# Patient Record
Sex: Female | Born: 2000 | Race: White | Hispanic: No | Marital: Single | Smoking: Never smoker
Health system: Southern US, Community
[De-identification: ages and names within clinical notes are randomized; demographics above are authoritative.]

## PROBLEM LIST (undated history)

## (undated) DIAGNOSIS — T7840XA Allergy, unspecified, initial encounter: Secondary | ICD-10-CM

## (undated) HISTORY — DX: Allergy, unspecified, initial encounter: T78.40XA

---
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
## 2022-05-22 ENCOUNTER — Other Ambulatory Visit: Payer: Self-pay | Admitting: Nurse Practitioner

## 2022-05-22 NOTE — Telephone Encounter (Signed)
Requested Prescriptions  Pending Prescriptions Disp Refills   fluticasone (FLONASE) 50 MCG/ACT nasal spray [Pharmacy Med Name: FLUTICASONE PROP 50 MCG SPRAY] 48 mL 1    Sig: USE 1-2 SPRAYS IN EACH NOSTRIL ONCE A DAY     Ear, Nose, and Throat: Nasal Preparations - Corticosteroids Passed - 05/22/2022 11:05 AM      Passed - Valid encounter within last 12 months    Recent Outpatient Visits           9 months ago Annual physical exam   [place] [facility] Larae Grooms, NP   1 year ago Seasonal allergies   [place] [facility] Larae Grooms, NP       Future Appointments             In 2 months Larae Grooms, NP [place] [facility]

## 2022-06-24 DIAGNOSIS — M25511 Pain in right shoulder: Secondary | ICD-10-CM | POA: Diagnosis not present

## 2022-06-24 DIAGNOSIS — M7918 Myalgia, other site: Secondary | ICD-10-CM | POA: Diagnosis not present

## 2022-06-24 DIAGNOSIS — M7501 Adhesive capsulitis of right shoulder: Secondary | ICD-10-CM | POA: Diagnosis not present

## 2022-06-25 DIAGNOSIS — M9901 Segmental and somatic dysfunction of cervical region: Secondary | ICD-10-CM | POA: Diagnosis not present

## 2022-06-25 DIAGNOSIS — M25511 Pain in right shoulder: Secondary | ICD-10-CM | POA: Diagnosis not present

## 2022-06-25 DIAGNOSIS — M7501 Adhesive capsulitis of right shoulder: Secondary | ICD-10-CM | POA: Diagnosis not present

## 2022-06-25 DIAGNOSIS — M7918 Myalgia, other site: Secondary | ICD-10-CM | POA: Diagnosis not present

## 2022-06-25 DIAGNOSIS — M5413 Radiculopathy, cervicothoracic region: Secondary | ICD-10-CM | POA: Diagnosis not present

## 2022-07-01 DIAGNOSIS — M7501 Adhesive capsulitis of right shoulder: Secondary | ICD-10-CM | POA: Diagnosis not present

## 2022-07-01 DIAGNOSIS — M7918 Myalgia, other site: Secondary | ICD-10-CM | POA: Diagnosis not present

## 2022-07-01 DIAGNOSIS — M9901 Segmental and somatic dysfunction of cervical region: Secondary | ICD-10-CM | POA: Diagnosis not present

## 2022-07-01 DIAGNOSIS — M5413 Radiculopathy, cervicothoracic region: Secondary | ICD-10-CM | POA: Diagnosis not present

## 2022-07-01 DIAGNOSIS — M25511 Pain in right shoulder: Secondary | ICD-10-CM | POA: Diagnosis not present

## 2022-07-03 DIAGNOSIS — M7501 Adhesive capsulitis of right shoulder: Secondary | ICD-10-CM | POA: Diagnosis not present

## 2022-07-03 DIAGNOSIS — M25511 Pain in right shoulder: Secondary | ICD-10-CM | POA: Diagnosis not present

## 2022-07-03 DIAGNOSIS — M7918 Myalgia, other site: Secondary | ICD-10-CM | POA: Diagnosis not present

## 2022-07-03 DIAGNOSIS — M5413 Radiculopathy, cervicothoracic region: Secondary | ICD-10-CM | POA: Diagnosis not present

## 2022-07-03 DIAGNOSIS — M9901 Segmental and somatic dysfunction of cervical region: Secondary | ICD-10-CM | POA: Diagnosis not present

## 2022-07-09 DIAGNOSIS — M5413 Radiculopathy, cervicothoracic region: Secondary | ICD-10-CM | POA: Diagnosis not present

## 2022-07-09 DIAGNOSIS — M9901 Segmental and somatic dysfunction of cervical region: Secondary | ICD-10-CM | POA: Diagnosis not present

## 2022-07-09 DIAGNOSIS — M25511 Pain in right shoulder: Secondary | ICD-10-CM | POA: Diagnosis not present

## 2022-07-09 DIAGNOSIS — M7501 Adhesive capsulitis of right shoulder: Secondary | ICD-10-CM | POA: Diagnosis not present

## 2022-07-09 DIAGNOSIS — M7918 Myalgia, other site: Secondary | ICD-10-CM | POA: Diagnosis not present

## 2022-07-10 DIAGNOSIS — M9901 Segmental and somatic dysfunction of cervical region: Secondary | ICD-10-CM | POA: Diagnosis not present

## 2022-07-10 DIAGNOSIS — M25511 Pain in right shoulder: Secondary | ICD-10-CM | POA: Diagnosis not present

## 2022-07-10 DIAGNOSIS — M7501 Adhesive capsulitis of right shoulder: Secondary | ICD-10-CM | POA: Diagnosis not present

## 2022-07-10 DIAGNOSIS — M7918 Myalgia, other site: Secondary | ICD-10-CM | POA: Diagnosis not present

## 2022-07-10 DIAGNOSIS — M5413 Radiculopathy, cervicothoracic region: Secondary | ICD-10-CM | POA: Diagnosis not present

## 2022-07-14 ENCOUNTER — Encounter: Payer: Self-pay | Admitting: Nurse Practitioner

## 2022-07-15 ENCOUNTER — Other Ambulatory Visit: Payer: Self-pay | Admitting: Nurse Practitioner

## 2022-07-15 DIAGNOSIS — M7918 Myalgia, other site: Secondary | ICD-10-CM | POA: Diagnosis not present

## 2022-07-15 DIAGNOSIS — M25511 Pain in right shoulder: Secondary | ICD-10-CM | POA: Diagnosis not present

## 2022-07-15 DIAGNOSIS — M9901 Segmental and somatic dysfunction of cervical region: Secondary | ICD-10-CM | POA: Diagnosis not present

## 2022-07-15 DIAGNOSIS — M7501 Adhesive capsulitis of right shoulder: Secondary | ICD-10-CM | POA: Diagnosis not present

## 2022-07-15 DIAGNOSIS — M5413 Radiculopathy, cervicothoracic region: Secondary | ICD-10-CM | POA: Diagnosis not present

## 2022-07-15 MED ORDER — NORTREL 7/7/7 0.5/0.75/1-35 MG-MCG PO TABS: 1.0000 | ORAL_TABLET | Freq: Every day | ORAL | 0 refills | Status: DC

## 2022-07-15 NOTE — Telephone Encounter (Signed)
Requested Prescriptions  Refused Prescriptions Disp Refills   NORTREL 7/7/7 0.5/0.75/1-35 MG-MCG tablet [Pharmacy Med Name: NORTREL 7-08-17-26 TABLET] 84 tablet 3    Sig: TAKE 1 TABLET BY MOUTH EVERY DAY     OB/GYN:  Contraceptives Passed - 07/15/2022  6:52 AM      Passed - Last BP in normal range    BP Readings from Last 1 Encounters:  08/08/21 109/73         Passed - Valid encounter within last 12 months    Recent Outpatient Visits           11 months ago Annual physical exam   [facility] [facility] Larae Grooms, NP   1 year ago Seasonal allergies   [place] [facility] Larae Grooms, NP       Future Appointments             In 3 weeks Larae Grooms, NP [facility]            Passed - Patient is not a smoker

## 2022-07-17 DIAGNOSIS — M5413 Radiculopathy, cervicothoracic region: Secondary | ICD-10-CM | POA: Diagnosis not present

## 2022-07-17 DIAGNOSIS — M7501 Adhesive capsulitis of right shoulder: Secondary | ICD-10-CM | POA: Diagnosis not present

## 2022-07-17 DIAGNOSIS — M7918 Myalgia, other site: Secondary | ICD-10-CM | POA: Diagnosis not present

## 2022-07-17 DIAGNOSIS — M25511 Pain in right shoulder: Secondary | ICD-10-CM | POA: Diagnosis not present

## 2022-07-17 DIAGNOSIS — M9901 Segmental and somatic dysfunction of cervical region: Secondary | ICD-10-CM | POA: Diagnosis not present

## 2022-07-22 DIAGNOSIS — M7501 Adhesive capsulitis of right shoulder: Secondary | ICD-10-CM | POA: Diagnosis not present

## 2022-07-22 DIAGNOSIS — M5413 Radiculopathy, cervicothoracic region: Secondary | ICD-10-CM | POA: Diagnosis not present

## 2022-07-22 DIAGNOSIS — M7918 Myalgia, other site: Secondary | ICD-10-CM | POA: Diagnosis not present

## 2022-07-22 DIAGNOSIS — M9901 Segmental and somatic dysfunction of cervical region: Secondary | ICD-10-CM | POA: Diagnosis not present

## 2022-07-22 DIAGNOSIS — M25511 Pain in right shoulder: Secondary | ICD-10-CM | POA: Diagnosis not present

## 2022-07-29 DIAGNOSIS — M7918 Myalgia, other site: Secondary | ICD-10-CM | POA: Diagnosis not present

## 2022-07-29 DIAGNOSIS — M7501 Adhesive capsulitis of right shoulder: Secondary | ICD-10-CM | POA: Diagnosis not present

## 2022-07-29 DIAGNOSIS — M5413 Radiculopathy, cervicothoracic region: Secondary | ICD-10-CM | POA: Diagnosis not present

## 2022-07-29 DIAGNOSIS — M9901 Segmental and somatic dysfunction of cervical region: Secondary | ICD-10-CM | POA: Diagnosis not present

## 2022-07-29 DIAGNOSIS — M25511 Pain in right shoulder: Secondary | ICD-10-CM | POA: Diagnosis not present

## 2022-08-05 DIAGNOSIS — M7918 Myalgia, other site: Secondary | ICD-10-CM | POA: Diagnosis not present

## 2022-08-05 DIAGNOSIS — M25511 Pain in right shoulder: Secondary | ICD-10-CM | POA: Diagnosis not present

## 2022-08-05 DIAGNOSIS — M5413 Radiculopathy, cervicothoracic region: Secondary | ICD-10-CM | POA: Diagnosis not present

## 2022-08-05 DIAGNOSIS — M7501 Adhesive capsulitis of right shoulder: Secondary | ICD-10-CM | POA: Diagnosis not present

## 2022-08-05 DIAGNOSIS — M9901 Segmental and somatic dysfunction of cervical region: Secondary | ICD-10-CM | POA: Diagnosis not present

## 2022-08-11 ENCOUNTER — Ambulatory Visit (INDEPENDENT_AMBULATORY_CARE_PROVIDER_SITE_OTHER): Payer: 59 | Admitting: Nurse Practitioner

## 2022-08-11 ENCOUNTER — Encounter: Payer: Self-pay | Admitting: Nurse Practitioner

## 2022-08-11 ENCOUNTER — Other Ambulatory Visit: Payer: Self-pay | Admitting: Nurse Practitioner

## 2022-08-11 VITALS — BP 120/82 | HR 81 | Temp 98.6°F | Ht 63.8 in | Wt 103.6 lb

## 2022-08-11 DIAGNOSIS — E782 Mixed hyperlipidemia: Secondary | ICD-10-CM | POA: Diagnosis not present

## 2022-08-11 DIAGNOSIS — Z0000 Encounter for general adult medical examination without abnormal findings: Secondary | ICD-10-CM

## 2022-08-11 LAB — URINALYSIS, ROUTINE W REFLEX MICROSCOPIC
Bilirubin, UA: NEGATIVE
Glucose, UA: NEGATIVE
Ketones, UA: NEGATIVE
Nitrite, UA: NEGATIVE
Protein,UA: NEGATIVE
Specific Gravity, UA: 1.02 (ref 1.005–1.030)
Urobilinogen, Ur: 0.2 mg/dL (ref 0.2–1.0)
pH, UA: 5.5 (ref 5.0–7.5)

## 2022-08-11 LAB — MICROSCOPIC EXAMINATION: Bacteria, UA: NONE SEEN

## 2022-08-11 MED ORDER — NORTREL 7/7/7 0.5/0.75/1-35 MG-MCG PO TABS: 1.0000 | ORAL_TABLET | Freq: Every day | ORAL | 3 refills | Status: DC

## 2022-08-11 MED ORDER — MONTELUKAST SODIUM 10 MG PO TABS: ORAL_TABLET | ORAL | 3 refills | Status: DC

## 2022-08-11 MED ORDER — LEVOCETIRIZINE DIHYDROCHLORIDE 5 MG PO TABS: ORAL_TABLET | ORAL | 3 refills | Status: DC

## 2022-08-11 NOTE — Progress Notes (Signed)
BP 120/82   Pulse 81   Temp 98.6 F (37 C) (Oral)   Ht 5' 3.8" (1.621 m)   Wt 103 lb 9.6 oz (47 kg)   LMP 07/28/2022 (Approximate)   SpO2 100%   BMI 17.89 kg/m    Subjective:    Patient ID: Stephanie Riddle, female    DOB: 10-11-2000, 22 y.o.   MRN: [id number]  HPI: Stephanie Riddle is a 22 y.o. female presenting on 08/11/2022 for comprehensive medical examination. Current medical complaints include:none  She currently lives with: Menopausal Symptoms: no   Denies HA, CP, SOB, dizziness, palpitations, visual changes, and lower extremity swelling.   Depression Screen done today and results listed below:     08/11/2022    8:56 AM 08/08/2021    8:53 AM 02/07/2021    9:00 AM  Depression screen PHQ 2/9  Decreased Interest 0 0 0  Down, Depressed, Hopeless 0 0 0  PHQ - 2 Score 0 0 0  Altered sleeping 0 0 0  Tired, decreased energy 0 0 0  Change in appetite 0 0 0  Feeling bad or failure about yourself  0 0 0  Trouble concentrating 0 0 0  Moving slowly or fidgety/restless 0 0 0  Suicidal thoughts 0 0 0  PHQ-9 Score 0 0 0  Difficult doing work/chores Not difficult at all Not difficult at all Not difficult at all    The patient does not have a history of falls. I did complete a risk assessment for falls. A plan of care for falls was documented.   Past Medical History:  Past Medical History:  Diagnosis Date   Allergies     Surgical History:  History reviewed. No pertinent surgical history.  Medications:  Current Outpatient Medications on File Prior to Visit  Medication Sig   fluticasone (FLONASE) 50 MCG/ACT nasal spray USE 1-2 SPRAYS IN EACH NOSTRIL ONCE A DAY   levocetirizine (XYZAL) 5 MG tablet SMARTSIG:1 Tablet(s) By Mouth Every Evening   montelukast (SINGULAIR) 10 MG tablet SMARTSIG:1 Tablet(s) By Mouth Every Evening   No current facility-administered medications on file prior to visit.    Allergies:  No Known Allergies  Social History:  Social History    Socioeconomic History   Marital status: Single    Spouse name: Not on file   Number of children: Not on file   Years of education: Not on file   Highest education level: Not on file  Occupational History   Not on file  Tobacco Use   Smoking status: Never   Smokeless tobacco: Never  Vaping Use   Vaping Use: Never used  Substance and Sexual Activity   Alcohol use: Never   Drug use: Never   Sexual activity: Yes    Birth control/protection: Pill  Other Topics Concern   Not on file  Social History Narrative   Not on file   Social Determinants of Health   Financial Resource Strain: Not on file  Food Insecurity: Not on file  Transportation Needs: Not on file  Physical Activity: Not on file  Stress: Not on file  Social Connections: Not on file  Intimate Partner Violence: Not on file   Social History   Tobacco Use  Smoking Status Never  Smokeless Tobacco Never   Social History   Substance and Sexual Activity  Alcohol Use Never    Family History:  History reviewed. No pertinent family history.  Past medical history, surgical history, medications, allergies, family history and  social history reviewed with patient today and changes made to appropriate areas of the chart.   Review of Systems  Eyes:  Negative for blurred vision and double vision.  Respiratory:  Negative for shortness of breath.   Cardiovascular:  Negative for chest pain, palpitations and leg swelling.  Neurological:  Negative for dizziness and headaches.   All other ROS negative except what is listed above and in the HPI.      Objective:    BP 120/82   Pulse 81   Temp 98.6 F (37 C) (Oral)   Ht 5' 3.8" (1.621 m)   Wt 103 lb 9.6 oz (47 kg)   LMP 07/28/2022 (Approximate)   SpO2 100%   BMI 17.89 kg/m   Wt Readings from Last 3 Encounters:  08/11/22 103 lb 9.6 oz (47 kg)  08/08/21 100 lb 9.6 oz (45.6 kg) (4 %, Z= -1.73)*  02/07/21 95 lb (43.1 kg) (1 %, Z= -2.23)*   * Growth percentiles are  based on CDC (Girls, 2-20 Years) data.    Physical Exam Vitals and nursing note reviewed.  Constitutional:      General: She is awake. She is not in acute distress.    Appearance: Normal appearance. She is well-developed and normal weight. She is not ill-appearing.  HENT:     Head: Normocephalic and atraumatic.     Right Ear: Hearing, tympanic membrane, ear canal and external ear normal. No drainage.     Left Ear: Hearing, tympanic membrane, ear canal and external ear normal. No drainage.     Nose: Nose normal.     Right Sinus: No maxillary sinus tenderness or frontal sinus tenderness.     Left Sinus: No maxillary sinus tenderness or frontal sinus tenderness.     Mouth/Throat:     Mouth: Mucous membranes are moist.     Pharynx: Oropharynx is clear. Uvula midline. No pharyngeal swelling, oropharyngeal exudate or posterior oropharyngeal erythema.  Eyes:     General: Lids are normal.        Right eye: No discharge.        Left eye: No discharge.     Extraocular Movements: Extraocular movements intact.     Conjunctiva/sclera: Conjunctivae normal.     Pupils: Pupils are equal, round, and reactive to light.     Visual Fields: Right eye visual fields normal and left eye visual fields normal.  Neck:     Thyroid: No thyromegaly.     Vascular: No carotid bruit.     Trachea: Trachea normal.  Cardiovascular:     Rate and Rhythm: Normal rate and regular rhythm.     Heart sounds: Normal heart sounds. No murmur heard.    No gallop.  Pulmonary:     Effort: Pulmonary effort is normal. No accessory muscle usage or respiratory distress.     Breath sounds: Normal breath sounds.  Chest:  Breasts:    Right: Normal.     Left: Normal.  Abdominal:     General: Bowel sounds are normal.     Palpations: Abdomen is soft. There is no hepatomegaly or splenomegaly.     Tenderness: There is no abdominal tenderness.  Musculoskeletal:        General: Normal range of motion.     Cervical back: Normal range  of motion and neck supple.     Right lower leg: No edema.     Left lower leg: No edema.  Lymphadenopathy:     Head:  Right side of head: No submental, submandibular, tonsillar, preauricular or posterior auricular adenopathy.     Left side of head: No submental, submandibular, tonsillar, preauricular or posterior auricular adenopathy.     Cervical: No cervical adenopathy.     Upper Body:     Right upper body: No supraclavicular, axillary or pectoral adenopathy.     Left upper body: No supraclavicular, axillary or pectoral adenopathy.  Skin:    General: Skin is warm and dry.     Capillary Refill: Capillary refill takes less than 2 seconds.     Findings: No rash.  Neurological:     Mental Status: She is alert and oriented to person, place, and time.     Gait: Gait is intact.     Deep Tendon Reflexes: Reflexes are normal and symmetric.     Reflex Scores:      Brachioradialis reflexes are 2+ on the right side and 2+ on the left side.      Patellar reflexes are 2+ on the right side and 2+ on the left side. Psychiatric:        Attention and Perception: Attention normal.        Mood and Affect: Mood normal.        Speech: Speech normal.        Behavior: Behavior normal. Behavior is cooperative.        Thought Content: Thought content normal.        Judgment: Judgment normal.     Results for orders placed or performed in visit on 08/30/21  CBC w/Diff  Result Value Ref Range   WBC 9.6 3.4 - 10.8 x10E3/uL   RBC 4.43 3.77 - 5.28 x10E6/uL   Hemoglobin 13.0 11.1 - 15.9 g/dL   Hematocrit 96.0 45.4 - 46.6 %   MCV 89 79 - 97 fL   MCH 29.3 26.6 - 33.0 pg   MCHC 32.9 31.5 - 35.7 g/dL   RDW 09.8 11.9 - 14.7 %   Platelets 423 150 - 450 x10E3/uL   Neutrophils 59 Not Estab. %   Lymphs 31 Not Estab. %   Monocytes 7 Not Estab. %   Eos 2 Not Estab. %   Basos 0 Not Estab. %   Neutrophils Absolute 5.6 1.4 - 7.0 x10E3/uL   Lymphocytes Absolute 3.0 0.7 - 3.1 x10E3/uL   Monocytes Absolute 0.7  0.1 - 0.9 x10E3/uL   EOS (ABSOLUTE) 0.2 0.0 - 0.4 x10E3/uL   Basophils Absolute 0.0 0.0 - 0.2 x10E3/uL   Immature Granulocytes 1 Not Estab. %   Immature Grans (Abs) 0.1 0.0 - 0.1 x10E3/uL      Assessment & Plan:   Problem List Items Addressed This Visit   None Visit Diagnoses     Annual physical exam    -  Primary   Health maintenance reviewed during visit today.  Labs ordered.  Vaccines up to date.   Relevant Orders   CBC with Differential/Platelet   Comprehensive metabolic panel   Lipid panel   TSH   Urinalysis, Routine w reflex microscopic   GC/Chlamydia Probe Amp   Mixed hyperlipidemia       Labs ordered at visit today.  Will make recommendations based on lab results.   Relevant Orders   Lipid panel        Follow up plan: Return in about 1 year (around 08/11/2023) for Physical and Fasting labs.   LABORATORY TESTING:  - Pap smear: not applicable  IMMUNIZATIONS:   - Tdap: Tetanus vaccination  status reviewed: last tetanus booster within 10 years. - Influenza: Postponed to flu season - Pneumovax: Not applicable - Prevnar: Not applicable - COVID: Not applicable - HPV: Up to date - Shingrix vaccine: Not applicable  SCREENING: -Mammogram: Not applicable  - Colonoscopy: Not applicable  - Bone Density: Not applicable  -Hearing Test: Not applicable  -Spirometry: Not applicable   PATIENT COUNSELING:   Advised to take 1 mg of folate supplement per day if capable of pregnancy.   Sexuality: Discussed sexually transmitted diseases, partner selection, use of condoms, avoidance of unintended pregnancy  and contraceptive alternatives.   Advised to avoid cigarette smoking.  I discussed with the patient that most people either abstain from alcohol or drink within safe limits (<=14/week and <=4 drinks/occasion for males, <=7/weeks and <= 3 drinks/occasion for females) and that the risk for alcohol disorders and other health effects rises proportionally with the number of  drinks per week and how often a drinker exceeds daily limits.  Discussed cessation/primary prevention of drug use and availability of treatment for abuse.   Diet: Encouraged to adjust caloric intake to maintain  or achieve ideal body weight, to reduce intake of dietary saturated fat and total fat, to limit sodium intake by avoiding high sodium foods and not adding table salt, and to maintain adequate dietary potassium and calcium preferably from fresh fruits, vegetables, and low-fat dairy products.    stressed the importance of regular exercise  Injury prevention: Discussed safety belts, safety helmets, smoke detector, smoking near bedding or upholstery.   Dental health: Discussed importance of regular tooth brushing, flossing, and dental visits.    NEXT PREVENTATIVE PHYSICAL DUE IN 1 YEAR. Return in about 1 year (around 08/11/2023) for Physical and Fasting labs.

## 2022-08-12 LAB — CBC WITH DIFFERENTIAL/PLATELET
Basophils Absolute: 0 10*3/uL (ref 0.0–0.2)
Basos: 1 %
EOS (ABSOLUTE): 0.4 10*3/uL (ref 0.0–0.4)
Eos: 5 %
Hematocrit: 39.3 % (ref 34.0–46.6)
Hemoglobin: 12.8 g/dL (ref 11.1–15.9)
Immature Grans (Abs): 0 10*3/uL (ref 0.0–0.1)
Immature Granulocytes: 0 %
Lymphocytes Absolute: 3.6 10*3/uL — ABNORMAL HIGH (ref 0.7–3.1)
Lymphs: 45 %
MCH: 29 pg (ref 26.6–33.0)
MCHC: 32.6 g/dL (ref 31.5–35.7)
MCV: 89 fL (ref 79–97)
Monocytes Absolute: 0.4 10*3/uL (ref 0.1–0.9)
Monocytes: 5 %
Neutrophils Absolute: 3.4 10*3/uL (ref 1.4–7.0)
Neutrophils: 44 %
Platelets: 312 10*3/uL (ref 150–450)
RBC: 4.42 x10E6/uL (ref 3.77–5.28)
RDW: 12.3 % (ref 11.7–15.4)
WBC: 7.9 10*3/uL (ref 3.4–10.8)

## 2022-08-12 LAB — COMPREHENSIVE METABOLIC PANEL
ALT: 6 IU/L (ref 0–32)
AST: 16 IU/L (ref 0–40)
Albumin: 4.1 g/dL (ref 4.0–5.0)
Alkaline Phosphatase: 73 IU/L (ref 42–106)
BUN/Creatinine Ratio: 17 (ref 9–23)
BUN: 13 mg/dL (ref 6–20)
Bilirubin Total: <0.2 mg/dL (ref 0.0–1.2)
CO2: 20 mmol/L (ref 20–29)
Calcium: 9.4 mg/dL (ref 8.7–10.2)
Chloride: 102 mmol/L (ref 96–106)
Creatinine, Ser: 0.76 mg/dL (ref 0.57–1.00)
Globulin, Total: 2.4 g/dL (ref 1.5–4.5)
Glucose: 92 mg/dL (ref 70–99)
Potassium: 4.2 mmol/L (ref 3.5–5.2)
Sodium: 137 mmol/L (ref 134–144)
Total Protein: 6.5 g/dL (ref 6.0–8.5)
eGFR: 115 mL/min/{1.73_m2} (ref >59)

## 2022-08-12 LAB — LIPID PANEL
Chol/HDL Ratio: 3.4 ratio (ref 0.0–4.4)
Cholesterol, Total: 201 mg/dL — ABNORMAL HIGH (ref 100–199)
HDL: 59 mg/dL (ref >39)
LDL Chol Calc (NIH): 121 mg/dL — ABNORMAL HIGH (ref 0–99)
Triglycerides: 118 mg/dL (ref 0–149)
VLDL Cholesterol Cal: 21 mg/dL (ref 5–40)

## 2022-08-12 LAB — TSH: TSH: 2.73 u[IU]/mL (ref 0.450–4.500)

## 2022-08-12 NOTE — Progress Notes (Signed)
HI [name]. It was nice to see you yesterday.  Your lab work looks good.  No concerns at this time. Continue with your current medication regimen.  Follow up as discussed.  Please let me know if you have any questions.

## 2022-08-12 NOTE — Telephone Encounter (Signed)
Unable to refill per protocol, Rx request was refilled 08/08/21.  Requested Prescriptions  Pending Prescriptions Disp Refills   montelukast (SINGULAIR) 10 MG tablet [Pharmacy Med Name: MONTELUKAST SOD 10 MG TABLET] 90 tablet 3    Sig: TAKE 1 TABLET BY MOUTH EVERY EVENING     Pulmonology:  Leukotriene Inhibitors Failed - 08/11/2022  2:44 AM      Failed - Valid encounter within last 12 months    Recent Outpatient Visits           Yesterday Annual physical exam   [place] [facility] Larae Grooms, NP   1 year ago Annual physical exam   [place] Larae Grooms, NP   1 year ago Seasonal allergies   [place] [facility] Larae Grooms, NP       Future Appointments             In 1 year Larae Grooms, NP [place] [facility]             levocetirizine (XYZAL) 5 MG tablet [Pharmacy Med Name: LEVOCETIRIZINE 5 MG TABLET] 90 tablet 3    Sig: TAKE 1 TABLET BY MOUTH EVERY EVENING     Ear, Nose, and Throat:  Antihistamines - levocetirizine dihydrochloride Failed - 08/11/2022  2:44 AM      Failed - Cr in normal range and within 360 days    Creatinine, Ser  Date Value Ref Range Status  08/11/2022 0.76 0.57 - 1.00 mg/dL Final         Failed - eGFR is 10 or above and within 360 days    eGFR  Date Value Ref Range Status  08/11/2022 115 >59 mL/min/1.73 Final         Failed - Valid encounter within last 12 months    Recent Outpatient Visits           Yesterday Annual physical exam   [place] [facility] Larae Grooms, NP   1 year ago Annual physical exam   [place] [facility] Larae Grooms, NP   1 year ago Seasonal allergies   [place] [facility] Larae Grooms, NP       Future Appointments             In 1 year Larae Grooms, NP [place] [facility]

## 2022-08-13 LAB — GC/CHLAMYDIA PROBE AMP
Chlamydia trachomatis, NAA: NEGATIVE
Neisseria Gonorrhoeae by PCR: NEGATIVE

## 2022-08-13 NOTE — Progress Notes (Signed)
HI Stephanie Riddle. Your STI screening was normal.

## 2022-11-23 ENCOUNTER — Other Ambulatory Visit: Payer: Self-pay | Admitting: Nurse Practitioner

## 2022-11-24 NOTE — Telephone Encounter (Signed)
Requested Prescriptions  Pending Prescriptions Disp Refills   fluticasone (FLONASE) 50 MCG/ACT nasal spray [Pharmacy Med Name: FLUTICASONE PROP 50 MCG SPRAY] 48 mL 1    Sig: USE 1-2 SPRAYS INTO INTO EACH NOSTRIL ONCE DAILY     Ear, Nose, and Throat: Nasal Preparations - Corticosteroids Passed - 11/23/2022  9:20 AM      Passed - Valid encounter within last 12 months    Recent Outpatient Visits           3 months ago Annual physical exam   [place] [facility] Larae Grooms, NP   1 year ago Annual physical exam   [place] [facility] Larae Grooms, NP   1 year ago Seasonal allergies   [place] [facility] Larae Grooms, NP       Future Appointments             In 8 months Larae Grooms, NP [place] [facility]

## 2023-08-12 ENCOUNTER — Other Ambulatory Visit (HOSPITAL_COMMUNITY)
Admission: RE | Admit: 2023-08-12 | Discharge: 2023-08-12 | Disposition: A | Discharge status: Home / Self Care | Source: Ambulatory Visit | Attending: Nurse Practitioner | Admitting: Nurse Practitioner

## 2023-08-12 ENCOUNTER — Ambulatory Visit (INDEPENDENT_AMBULATORY_CARE_PROVIDER_SITE_OTHER): Payer: Self-pay | Admitting: Nurse Practitioner

## 2023-08-12 VITALS — BP 113/79 | HR 77 | Ht 62.0 in | Wt 105.0 lb

## 2023-08-12 DIAGNOSIS — Z0000 Encounter for general adult medical examination without abnormal findings: Secondary | ICD-10-CM

## 2023-08-12 DIAGNOSIS — Z23 Encounter for immunization: Secondary | ICD-10-CM | POA: Diagnosis not present

## 2023-08-12 DIAGNOSIS — Z136 Encounter for screening for cardiovascular disorders: Secondary | ICD-10-CM

## 2023-08-12 NOTE — Progress Notes (Signed)
 BP 113/79   Pulse 77   Ht 5' 2 (1.575 m)   Wt 105 lb (47.6 kg)   LMP 07/22/2023   SpO2 97%   BMI 19.20 kg/m    Subjective:    Patient ID: Stephanie Riddle, female    DOB: [birth year], 23 y.o.   MRN: [id number]  HPI: Stephanie Riddle is a 23 y.o. female presenting on 08/12/2023 for comprehensive medical examination. Current medical complaints include:none  She currently lives with: Menopausal Symptoms: no   Denies HA, CP, SOB, dizziness, palpitations, visual changes, and lower extremity swelling.   Depression Screen done today and results listed below:     08/12/2023    8:20 AM 08/11/2022    8:56 AM 08/08/2021    8:53 AM 02/07/2021    9:00 AM  Depression screen PHQ 2/9  Decreased Interest 0 0 0 0  Down, Depressed, Hopeless 0 0 0 0  PHQ - 2 Score 0 0 0 0  Altered sleeping 0 0 0 0  Tired, decreased energy 0 0 0 0  Change in appetite 0 0 0 0  Feeling bad or failure about yourself  0 0 0 0  Trouble concentrating 0 0 0 0  Moving slowly or fidgety/restless 0 0 0 0  Suicidal thoughts 0 0 0 0  PHQ-9 Score 0 0 0 0  Difficult doing work/chores Not difficult at all Not difficult at all Not difficult at all Not difficult at all    The patient does not have a history of falls. I did complete a risk assessment for falls. A plan of care for falls was documented.   Past Medical History:  Past Medical History:  Diagnosis Date   Allergies     Surgical History:  History reviewed. No pertinent surgical history.  Medications:  Current Outpatient Medications on File Prior to Visit  Medication Sig   fluticasone  (FLONASE ) 50 MCG/ACT nasal spray USE 1-2 SPRAYS INTO INTO EACH NOSTRIL ONCE DAILY   levocetirizine (XYZAL ) 5 MG tablet SMARTSIG:1 Tablet(s) By Mouth Every Evening   montelukast  (SINGULAIR ) 10 MG tablet SMARTSIG:1 Tablet(s) By Mouth Every Evening   NORTREL  7/7/7 0.5/0.75/1-35 MG-MCG tablet Take 1 tablet by mouth daily.   No current facility-administered medications on file  prior to visit.    Allergies:  No Known Allergies  Social History:  Social History   Socioeconomic History   Marital status: Single    Spouse name: Not on file   Number of children: Not on file   Years of education: Not on file   Highest education level: 12th grade  Occupational History   Not on file  Tobacco Use   Smoking status: Never   Smokeless tobacco: Never  Vaping Use   Vaping status: Never Used  Substance and Sexual Activity   Alcohol use: Never   Drug use: Never   Sexual activity: Yes    Birth control/protection: Pill  Other Topics Concern   Not on file  Social History Narrative   Not on file   Social Drivers of Health   Financial Resource Strain: Low Risk  (08/12/2023)   Overall Financial Resource Strain (CARDIA)    Difficulty of Paying Living Expenses: Not hard at all  Food Insecurity: No Food Insecurity (08/12/2023)   Hunger Vital Sign    Worried About Running Out of Food in the Last Year: Never true    Ran Out of Food in the Last Year: Never true  Transportation Needs: No Transportation Needs (08/12/2023)  PRAPARE - Administrator, Civil Service (Medical): No    Lack of Transportation (Non-Medical): No  Physical Activity: Sufficiently Active (08/12/2023)   Exercise Vital Sign    Days of Exercise per Week: 3 days    Minutes of Exercise per Session: 60 min  Stress: No Stress Concern Present (08/12/2023)   [employer] of Occupational Health - Occupational Stress Questionnaire    Feeling of Stress: Not at all  Social Connections: Unknown (08/12/2023)   Social Connection and Isolation Panel    Frequency of Communication with Friends and Family: Twice a week    Frequency of Social Gatherings with Friends and Family: Twice a week    Attends Religious Services: Never    Database administrator or Organizations: Yes    Attends Engineer, structural: More than 4 times per year    Marital Status: Not on file  Intimate Partner Violence: Not on  file   Social History   Tobacco Use  Smoking Status Never  Smokeless Tobacco Never   Social History   Substance and Sexual Activity  Alcohol Use Never    Family History:  History reviewed. No pertinent family history.  Past medical history, surgical history, medications, allergies, family history and social history reviewed with patient today and changes made to appropriate areas of the chart.   Review of Systems  Eyes:  Negative for blurred vision and double vision.  Respiratory:  Negative for shortness of breath.   Cardiovascular:  Negative for chest pain, palpitations and leg swelling.  Neurological:  Negative for dizziness and headaches.   All other ROS negative except what is listed above and in the HPI.      Objective:    BP 113/79   Pulse 77   Ht 5' 2 (1.575 m)   Wt 105 lb (47.6 kg)   LMP 07/22/2023   SpO2 97%   BMI 19.20 kg/m   Wt Readings from Last 3 Encounters:  08/12/23 105 lb (47.6 kg)  08/11/22 103 lb 9.6 oz (47 kg)  08/08/21 100 lb 9.6 oz (45.6 kg) (4%, Z= -1.73)*   * Growth percentiles are based on CDC (Girls, 2-20 Years) data.    Physical Exam Vitals and nursing note reviewed. Exam conducted with a chaperone present [place], CMA).  Constitutional:      General: She is awake. She is not in acute distress.    Appearance: She is well-developed. She is not ill-appearing.  HENT:     Head: Normocephalic and atraumatic.     Right Ear: Hearing, tympanic membrane, ear canal and external ear normal. No drainage.     Left Ear: Hearing, tympanic membrane, ear canal and external ear normal. No drainage.     Nose: Nose normal.     Right Sinus: No maxillary sinus tenderness or frontal sinus tenderness.     Left Sinus: No maxillary sinus tenderness or frontal sinus tenderness.     Mouth/Throat:     Mouth: Mucous membranes are moist.     Pharynx: Oropharynx is clear. Uvula midline. No pharyngeal swelling, oropharyngeal exudate or posterior oropharyngeal  erythema.  Eyes:     General: Lids are normal.        Right eye: No discharge.        Left eye: No discharge.     Extraocular Movements: Extraocular movements intact.     Conjunctiva/sclera: Conjunctivae normal.     Pupils: Pupils are equal, round, and reactive to light.  Visual Fields: Right eye visual fields normal and left eye visual fields normal.  Neck:     Thyroid: No thyromegaly.     Vascular: No carotid bruit.     Trachea: Trachea normal.  Cardiovascular:     Rate and Rhythm: Normal rate and regular rhythm.     Heart sounds: Normal heart sounds. No murmur heard.    No gallop.  Pulmonary:     Effort: Pulmonary effort is normal. No accessory muscle usage or respiratory distress.     Breath sounds: Normal breath sounds.  Chest:  Breasts:    Right: Normal.     Left: Normal.  Abdominal:     General: Bowel sounds are normal.     Palpations: Abdomen is soft. There is no hepatomegaly or splenomegaly.     Tenderness: There is no abdominal tenderness.  Genitourinary:    Vagina: Normal.     Cervix: Normal.     Adnexa: Right adnexa normal and left adnexa normal.  Musculoskeletal:        General: Normal range of motion.     Cervical back: Normal range of motion and neck supple.     Right lower leg: No edema.     Left lower leg: No edema.  Lymphadenopathy:     Head:     Right side of head: No submental, submandibular, tonsillar, preauricular or posterior auricular adenopathy.     Left side of head: No submental, submandibular, tonsillar, preauricular or posterior auricular adenopathy.     Cervical: No cervical adenopathy.     Upper Body:     Right upper body: No supraclavicular, axillary or pectoral adenopathy.     Left upper body: No supraclavicular, axillary or pectoral adenopathy.  Skin:    General: Skin is warm and dry.     Capillary Refill: Capillary refill takes less than 2 seconds.     Findings: No rash.  Neurological:     Mental Status: She is alert and oriented  to person, place, and time.     Gait: Gait is intact.  Psychiatric:        Attention and Perception: Attention normal.        Mood and Affect: Mood normal.        Speech: Speech normal.        Behavior: Behavior normal. Behavior is cooperative.        Thought Content: Thought content normal.        Judgment: Judgment normal.     Results for orders placed or performed in visit on 08/11/22  GC/Chlamydia Probe Amp   Collection Time: 08/11/22  9:06 AM   Specimen: Urine   UR  Result Value Ref Range   Chlamydia trachomatis, NAA Negative Negative   Neisseria Gonorrhoeae by PCR Negative Negative  Microscopic Examination   Collection Time: 08/11/22  9:06 AM   Urine  Result Value Ref Range   WBC, UA 0-5 0 - 5 /hpf   RBC, Urine 3-10 (A) 0 - 2 /hpf   Epithelial Cells (non renal) 0-10 0 - 10 /hpf   Bacteria, UA None seen None seen/Few  Urinalysis, Routine w reflex microscopic   Collection Time: 08/11/22  9:06 AM  Result Value Ref Range   Specific Gravity, UA 1.020 1.005 - 1.030   pH, UA 5.5 5.0 - 7.5   Color, UA Yellow Yellow   Appearance Ur Clear Clear   Leukocytes,UA Trace (A) Negative   Protein,UA Negative Negative/Trace   Glucose, UA Negative Negative  Ketones, UA Negative Negative   RBC, UA 2+ (A) Negative   Bilirubin, UA Negative Negative   Urobilinogen, Ur 0.2 0.2 - 1.0 mg/dL   Nitrite, UA Negative Negative   Microscopic Examination See below:   CBC with Differential/Platelet   Collection Time: 08/11/22  9:08 AM  Result Value Ref Range   WBC 7.9 3.4 - 10.8 x10E3/uL   RBC 4.42 3.77 - 5.28 x10E6/uL   Hemoglobin 12.8 11.1 - 15.9 g/dL   Hematocrit 60.6 65.9 - 46.6 %   MCV 89 79 - 97 fL   MCH 29.0 26.6 - 33.0 pg   MCHC 32.6 31.5 - 35.7 g/dL   RDW 87.6 88.2 - 84.5 %   Platelets 312 150 - 450 x10E3/uL   Neutrophils 44 Not Estab. %   Lymphs 45 Not Estab. %   Monocytes 5 Not Estab. %   Eos 5 Not Estab. %   Basos 1 Not Estab. %   Neutrophils Absolute 3.4 1.4 - 7.0  x10E3/uL   Lymphocytes Absolute 3.6 (H) 0.7 - 3.1 x10E3/uL   Monocytes Absolute 0.4 0.1 - 0.9 x10E3/uL   EOS (ABSOLUTE) 0.4 0.0 - 0.4 x10E3/uL   Basophils Absolute 0.0 0.0 - 0.2 x10E3/uL   Immature Granulocytes 0 Not Estab. %   Immature Grans (Abs) 0.0 0.0 - 0.1 x10E3/uL  Comprehensive metabolic panel   Collection Time: 08/11/22  9:08 AM  Result Value Ref Range   Glucose 92 70 - 99 mg/dL   BUN 13 6 - 20 mg/dL   Creatinine, Ser 9.23 0.57 - 1.00 mg/dL   eGFR 884 >40 fO/fpw/8.26   BUN/Creatinine Ratio 17 9 - 23   Sodium 137 134 - 144 mmol/L   Potassium 4.2 3.5 - 5.2 mmol/L   Chloride 102 96 - 106 mmol/L   CO2 20 20 - 29 mmol/L   Calcium 9.4 8.7 - 10.2 mg/dL   Total Protein 6.5 6.0 - 8.5 g/dL   Albumin 4.1 4.0 - 5.0 g/dL   Globulin, Total 2.4 1.5 - 4.5 g/dL   Bilirubin Total <9.7 0.0 - 1.2 mg/dL   Alkaline Phosphatase 73 42 - 106 IU/L   AST 16 0 - 40 IU/L   ALT 6 0 - 32 IU/L  Lipid panel   Collection Time: 08/11/22  9:08 AM  Result Value Ref Range   Cholesterol, Total 201 (H) 100 - 199 mg/dL   Triglycerides 881 0 - 149 mg/dL   HDL 59 >60 mg/dL   VLDL Cholesterol Cal 21 5 - 40 mg/dL   LDL Chol Calc (NIH) 878 (H) 0 - 99 mg/dL   Chol/HDL Ratio 3.4 0.0 - 4.4 ratio  TSH   Collection Time: 08/11/22  9:08 AM  Result Value Ref Range   TSH 2.730 0.450 - 4.500 uIU/mL      Assessment & Plan:   Problem List Items Addressed This Visit   None Visit Diagnoses       Annual physical exam    -  Primary   Health maintenance reviewed during visit today.  Labs ordered.  TDAP given.  PAP done.   Relevant Orders   CBC with Differential/Platelet   Comprehensive metabolic panel with GFR   Lipid panel   TSH   Cytology - PAP   Chlamydia/Gonococcus/Trichomonas, NAA([facility])     Screening for ischemic heart disease       Relevant Orders   Lipid panel     Need for tetanus booster       Relevant Orders  Tdap vaccine greater than or equal to 7yo IM        Follow up plan: No  follow-ups on file.   LABORATORY TESTING:  - Pap smear: pap done  IMMUNIZATIONS:   - Tdap: Tetanus vaccination status reviewed: Given today. - Influenza: Postponed to flu season - Pneumovax: Not applicable - Prevnar: Not applicable - COVID: Not applicable - HPV: Up to date - Shingrix vaccine: Not applicable  SCREENING: -Mammogram: Not applicable  - Colonoscopy: Not applicable  - Bone Density: Not applicable  -Hearing Test: Not applicable  -Spirometry: Not applicable   PATIENT COUNSELING:   Advised to take 1 mg of folate supplement per day if capable of pregnancy.   Sexuality: Discussed sexually transmitted diseases, partner selection, use of condoms, avoidance of unintended pregnancy  and contraceptive alternatives.   Advised to avoid cigarette smoking.  I discussed with the patient that most people either abstain from alcohol or drink within safe limits (<=14/week and <=4 drinks/occasion for males, <=7/weeks and <= 3 drinks/occasion for females) and that the risk for alcohol disorders and other health effects rises proportionally with the number of drinks per week and how often a drinker exceeds daily limits.  Discussed cessation/primary prevention of drug use and availability of treatment for abuse.   Diet: Encouraged to adjust caloric intake to maintain  or achieve ideal body weight, to reduce intake of dietary saturated fat and total fat, to limit sodium intake by avoiding high sodium foods and not adding table salt, and to maintain adequate dietary potassium and calcium preferably from fresh fruits, vegetables, and low-fat dairy products.    stressed the importance of regular exercise  Injury prevention: Discussed safety belts, safety helmets, smoke detector, smoking near bedding or upholstery.   Dental health: Discussed importance of regular tooth brushing, flossing, and dental visits.    NEXT PREVENTATIVE PHYSICAL DUE IN 1 YEAR. No follow-ups on  file.

## 2023-08-13 ENCOUNTER — Ambulatory Visit: Payer: Self-pay | Admitting: Nurse Practitioner

## 2023-08-13 LAB — LIPID PANEL
Chol/HDL Ratio: 3.4 ratio (ref 0.0–4.4)
Cholesterol, Total: 195 mg/dL (ref 100–199)
HDL: 57 mg/dL (ref >39)
LDL Chol Calc (NIH): 125 mg/dL — ABNORMAL HIGH (ref 0–99)
Triglycerides: 72 mg/dL (ref 0–149)
VLDL Cholesterol Cal: 13 mg/dL (ref 5–40)

## 2023-08-13 LAB — COMPREHENSIVE METABOLIC PANEL WITH GFR
ALT: 8 IU/L (ref 0–32)
AST: 20 IU/L (ref 0–40)
Albumin: 4.2 g/dL (ref 4.0–5.0)
Alkaline Phosphatase: 67 IU/L (ref 44–121)
BUN/Creatinine Ratio: 11 (ref 9–23)
BUN: 10 mg/dL (ref 6–20)
Bilirubin Total: 0.2 mg/dL (ref 0.0–1.2)
CO2: 21 mmol/L (ref 20–29)
Calcium: 9.1 mg/dL (ref 8.7–10.2)
Chloride: 103 mmol/L (ref 96–106)
Creatinine, Ser: 0.88 mg/dL (ref 0.57–1.00)
Globulin, Total: 2.5 g/dL (ref 1.5–4.5)
Glucose: 82 mg/dL (ref 70–99)
Potassium: 4.1 mmol/L (ref 3.5–5.2)
Sodium: 140 mmol/L (ref 134–144)
Total Protein: 6.7 g/dL (ref 6.0–8.5)
eGFR: 96 mL/min/{1.73_m2} (ref >59)

## 2023-08-13 LAB — CBC WITH DIFFERENTIAL/PLATELET
Basophils Absolute: 0 10*3/uL (ref 0.0–0.2)
Basos: 1 %
EOS (ABSOLUTE): 0.2 10*3/uL (ref 0.0–0.4)
Eos: 3 %
Hematocrit: 39.5 % (ref 34.0–46.6)
Hemoglobin: 12.8 g/dL (ref 11.1–15.9)
Immature Grans (Abs): 0 10*3/uL (ref 0.0–0.1)
Immature Granulocytes: 0 %
Lymphocytes Absolute: 2.6 10*3/uL (ref 0.7–3.1)
Lymphs: 39 %
MCH: 29.6 pg (ref 26.6–33.0)
MCHC: 32.4 g/dL (ref 31.5–35.7)
MCV: 91 fL (ref 79–97)
Monocytes Absolute: 0.3 10*3/uL (ref 0.1–0.9)
Monocytes: 5 %
Neutrophils Absolute: 3.5 10*3/uL (ref 1.4–7.0)
Neutrophils: 52 %
Platelets: 268 10*3/uL (ref 150–450)
RBC: 4.32 x10E6/uL (ref 3.77–5.28)
RDW: 11.8 % (ref 11.7–15.4)
WBC: 6.6 10*3/uL (ref 3.4–10.8)

## 2023-08-13 LAB — TSH: TSH: 2.25 u[IU]/mL (ref 0.450–4.500)

## 2023-08-14 LAB — CHLAMYDIA/GONOCOCCUS/TRICHOMONAS, NAA
Chlamydia by NAA: NEGATIVE
Gonococcus by NAA: NEGATIVE
Trich vag by NAA: NEGATIVE

## 2023-08-17 LAB — CYTOLOGY - PAP: Adequacy: ABSENT

## 2023-08-20 ENCOUNTER — Other Ambulatory Visit: Payer: Self-pay | Admitting: Nurse Practitioner

## 2023-08-21 NOTE — Telephone Encounter (Signed)
 Requested Prescriptions  Pending Prescriptions Disp Refills   montelukast  (SINGULAIR ) 10 MG tablet [Pharmacy Med Name: MONTELUKAST  SOD 10 MG TABLET] 90 tablet 3    Sig: TAKE 1 TABLET BY MOUTH EVERY EVENING     Pulmonology:  Leukotriene Inhibitors Passed - 08/21/2023  3:46 PM      Passed - Valid encounter within last 12 months    Recent Outpatient Visits           1 week ago Annual physical exam   [place] [facility] Melvin Pao, NP               levocetirizine (XYZAL ) 5 MG tablet [Pharmacy Med Name: LEVOCETIRIZINE 5 MG TABLET] 90 tablet 3    Sig: TAKE 1 TABLET BY MOUTH EVERY EVENING     Ear, Nose, and Throat:  Antihistamines - levocetirizine dihydrochloride  Passed - 08/21/2023  3:46 PM      Passed - Cr in normal range and within 360 days    Creatinine, Ser  Date Value Ref Range Status  08/12/2023 0.88 0.57 - 1.00 mg/dL Final         Passed - eGFR is 10 or above and within 360 days    eGFR  Date Value Ref Range Status  08/12/2023 96 >59 mL/min/1.73 Final         Passed - Valid encounter within last 12 months    Recent Outpatient Visits           1 week ago Annual physical exam   [place] [facility] Melvin Pao, NP

## 2023-08-23 ENCOUNTER — Encounter: Payer: Self-pay | Admitting: Nurse Practitioner

## 2023-08-24 MED ORDER — NORTREL 7/7/7 0.5/0.75/1-35 MG-MCG PO TABS: 1.0000 | ORAL_TABLET | Freq: Every day | ORAL | 3 refills | Status: AC

## 2024-08-15 ENCOUNTER — Encounter: Admitting: Nurse Practitioner
